# Patient Record
Sex: Female | Born: 1965 | Race: White | Hispanic: No | Marital: Single | Smoking: Current every day smoker
Health system: Southern US, Community
[De-identification: ages and names within clinical notes are randomized; demographics above are authoritative.]

## PROBLEM LIST (undated history)

## (undated) DIAGNOSIS — C801 Malignant (primary) neoplasm, unspecified: Secondary | ICD-10-CM

## (undated) DIAGNOSIS — F1910 Other psychoactive substance abuse, uncomplicated: Secondary | ICD-10-CM

## (undated) DIAGNOSIS — T7840XA Allergy, unspecified, initial encounter: Secondary | ICD-10-CM

## (undated) DIAGNOSIS — F329 Major depressive disorder, single episode, unspecified: Secondary | ICD-10-CM

## (undated) DIAGNOSIS — F32A Depression, unspecified: Secondary | ICD-10-CM

## (undated) HISTORY — DX: Allergy, unspecified, initial encounter: T78.40XA

## (undated) HISTORY — DX: Malignant (primary) neoplasm, unspecified: C80.1

## (undated) HISTORY — DX: Depression, unspecified: F32.A

## (undated) HISTORY — DX: Major depressive disorder, single episode, unspecified: F32.9

---

## 1997-07-23 ENCOUNTER — Other Ambulatory Visit: Admission: RE | Admit: 1997-07-23 | Discharge: 1997-07-23 | Payer: Self-pay | Admitting: Obstetrics and Gynecology

## 1998-02-04 ENCOUNTER — Other Ambulatory Visit: Admission: RE | Admit: 1998-02-04 | Discharge: 1998-02-04 | Payer: Self-pay | Admitting: Obstetrics and Gynecology

## 2000-03-22 ENCOUNTER — Other Ambulatory Visit: Admission: RE | Admit: 2000-03-22 | Discharge: 2000-03-22 | Payer: Self-pay | Admitting: Obstetrics and Gynecology

## 2002-05-07 ENCOUNTER — Other Ambulatory Visit: Admission: RE | Admit: 2002-05-07 | Discharge: 2002-05-07 | Payer: Self-pay | Admitting: Obstetrics and Gynecology

## 2003-06-26 ENCOUNTER — Other Ambulatory Visit: Admission: RE | Admit: 2003-06-26 | Discharge: 2003-06-26 | Payer: Self-pay | Admitting: Obstetrics and Gynecology

## 2004-07-14 ENCOUNTER — Other Ambulatory Visit: Admission: RE | Admit: 2004-07-14 | Discharge: 2004-07-14 | Payer: Self-pay | Admitting: Obstetrics and Gynecology

## 2007-01-15 ENCOUNTER — Encounter: Admission: RE | Admit: 2007-01-15 | Discharge: 2007-01-15 | Payer: Self-pay | Admitting: Obstetrics and Gynecology

## 2010-01-04 ENCOUNTER — Encounter
Admission: RE | Admit: 2010-01-04 | Discharge: 2010-01-04 | Payer: Self-pay | Source: Home / Self Care | Attending: Obstetrics and Gynecology | Admitting: Obstetrics and Gynecology

## 2010-01-23 ENCOUNTER — Encounter: Payer: Self-pay | Admitting: Obstetrics and Gynecology

## 2010-02-09 ENCOUNTER — Emergency Department (HOSPITAL_COMMUNITY)
Admission: EM | Admit: 2010-02-09 | Discharge: 2010-02-10 | Disposition: A | Discharge status: Home / Self Care | Payer: Self-pay | Attending: Emergency Medicine | Admitting: Emergency Medicine

## 2010-02-09 DIAGNOSIS — H5789 Other specified disorders of eye and adnexa: Secondary | ICD-10-CM | POA: Insufficient documentation

## 2010-02-09 DIAGNOSIS — Z79899 Other long term (current) drug therapy: Secondary | ICD-10-CM | POA: Insufficient documentation

## 2010-06-22 ENCOUNTER — Other Ambulatory Visit (HOSPITAL_COMMUNITY): Payer: Self-pay | Admitting: Family Medicine

## 2010-06-22 ENCOUNTER — Ambulatory Visit (HOSPITAL_COMMUNITY)
Admission: RE | Admit: 2010-06-22 | Discharge: 2010-06-22 | Disposition: A | Discharge status: Home / Self Care | Payer: Self-pay | Source: Ambulatory Visit | Attending: Family Medicine | Admitting: Family Medicine

## 2010-06-22 DIAGNOSIS — X58XXXA Exposure to other specified factors, initial encounter: Secondary | ICD-10-CM | POA: Insufficient documentation

## 2010-06-22 DIAGNOSIS — R52 Pain, unspecified: Secondary | ICD-10-CM

## 2010-06-22 DIAGNOSIS — S8990XA Unspecified injury of unspecified lower leg, initial encounter: Secondary | ICD-10-CM | POA: Insufficient documentation

## 2010-06-22 DIAGNOSIS — M25579 Pain in unspecified ankle and joints of unspecified foot: Secondary | ICD-10-CM | POA: Insufficient documentation

## 2011-12-22 ENCOUNTER — Encounter (HOSPITAL_BASED_OUTPATIENT_CLINIC_OR_DEPARTMENT_OTHER): Payer: Self-pay | Admitting: *Deleted

## 2011-12-22 ENCOUNTER — Emergency Department (HOSPITAL_BASED_OUTPATIENT_CLINIC_OR_DEPARTMENT_OTHER)
Admission: EM | Admit: 2011-12-22 | Discharge: 2011-12-23 | Disposition: A | Discharge status: Home / Self Care | Payer: Self-pay | Attending: Emergency Medicine | Admitting: Emergency Medicine

## 2011-12-22 DIAGNOSIS — F17200 Nicotine dependence, unspecified, uncomplicated: Secondary | ICD-10-CM | POA: Insufficient documentation

## 2011-12-22 DIAGNOSIS — W260XXA Contact with knife, initial encounter: Secondary | ICD-10-CM | POA: Insufficient documentation

## 2011-12-22 DIAGNOSIS — F1910 Other psychoactive substance abuse, uncomplicated: Secondary | ICD-10-CM | POA: Insufficient documentation

## 2011-12-22 DIAGNOSIS — Y929 Unspecified place or not applicable: Secondary | ICD-10-CM | POA: Insufficient documentation

## 2011-12-22 DIAGNOSIS — W261XXA Contact with sword or dagger, initial encounter: Secondary | ICD-10-CM | POA: Insufficient documentation

## 2011-12-22 DIAGNOSIS — Z23 Encounter for immunization: Secondary | ICD-10-CM | POA: Insufficient documentation

## 2011-12-22 DIAGNOSIS — S61409A Unspecified open wound of unspecified hand, initial encounter: Secondary | ICD-10-CM | POA: Insufficient documentation

## 2011-12-22 DIAGNOSIS — Y9389 Activity, other specified: Secondary | ICD-10-CM | POA: Insufficient documentation

## 2011-12-22 DIAGNOSIS — S61412A Laceration without foreign body of left hand, initial encounter: Secondary | ICD-10-CM

## 2011-12-22 HISTORY — DX: Other psychoactive substance abuse, uncomplicated: F19.10

## 2011-12-22 MED ORDER — TETANUS-DIPHTH-ACELL PERTUSSIS 5-2.5-18.5 LF-MCG/0.5 IM SUSP
0.5000 mL | Freq: Once | INTRAMUSCULAR | Status: AC
  Administered 2011-12-22: 0.5 mL via INTRAMUSCULAR
  Filled 2011-12-22: qty 0.5

## 2011-12-22 MED ORDER — LIDOCAINE-EPINEPHRINE 2 %-1:100000 IJ SOLN
INTRAMUSCULAR | Status: AC
  Administered 2011-12-22: 1 mL
  Filled 2011-12-22: qty 1

## 2011-12-22 NOTE — ED Notes (Signed)
MD at bedside. 
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
## 2011-12-23 NOTE — ED Notes (Signed)
D/c home with friend- no rx given - verbalizes understanding of home care instructions

## 2011-12-23 NOTE — ED Notes (Signed)
MD Bonk at bedside to suture
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
## 2012-03-02 IMAGING — CR DG ANKLE COMPLETE 3+V*L*
3 series · 3 of 3 positions shown · non-contrast
Comparison: None

CLINICAL DATA: Left ankle pain.

LEFT ANKLE COMPLETE - 3+ VIEW

[x ankle ap left]
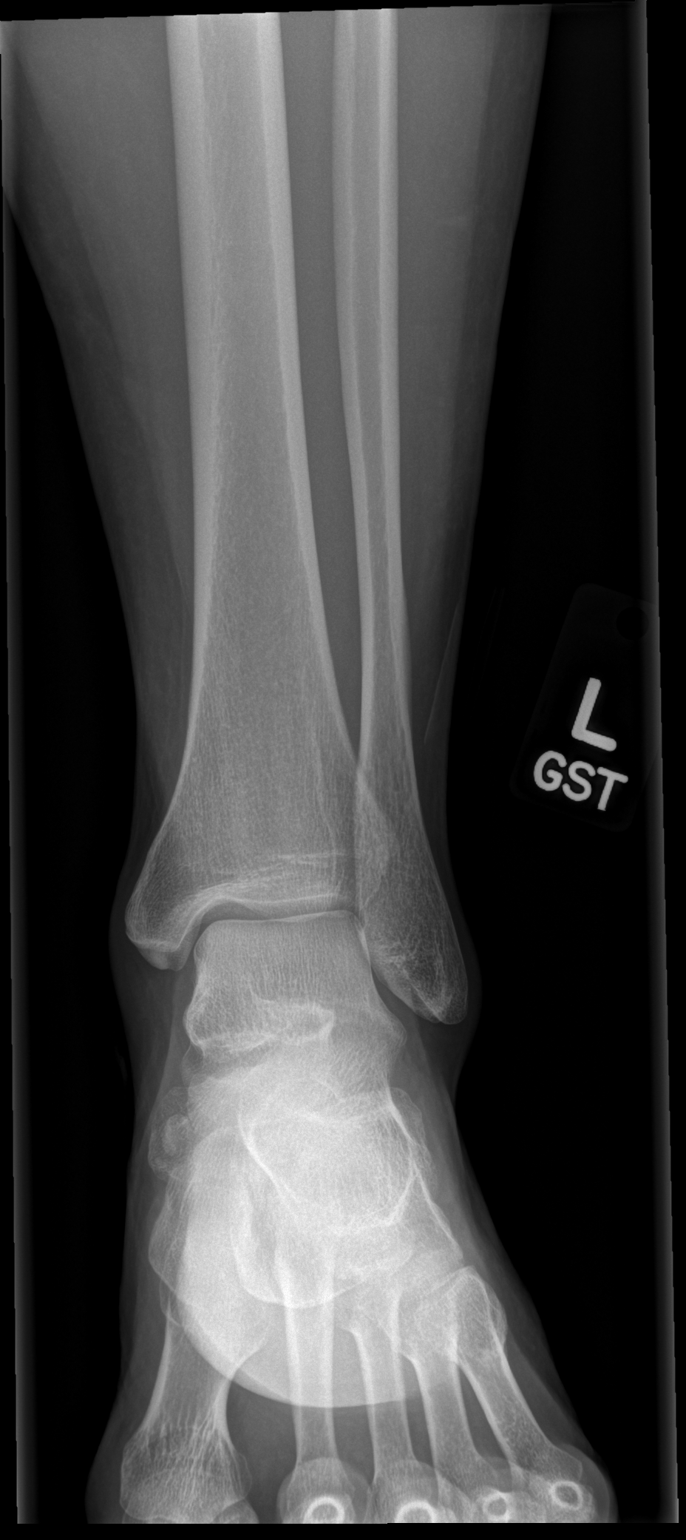

[x ankle obl left]
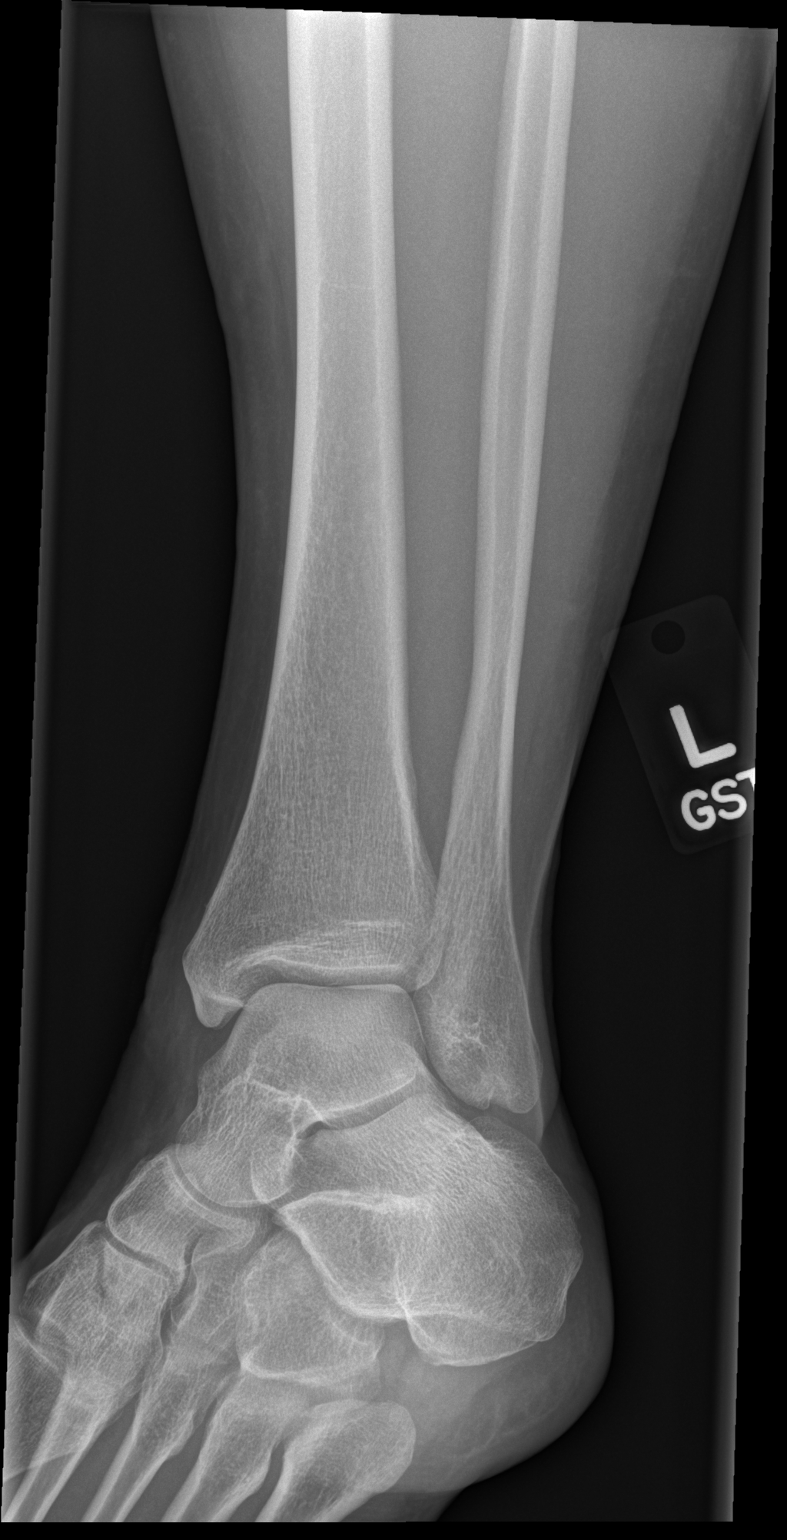

[x ankle lat left]
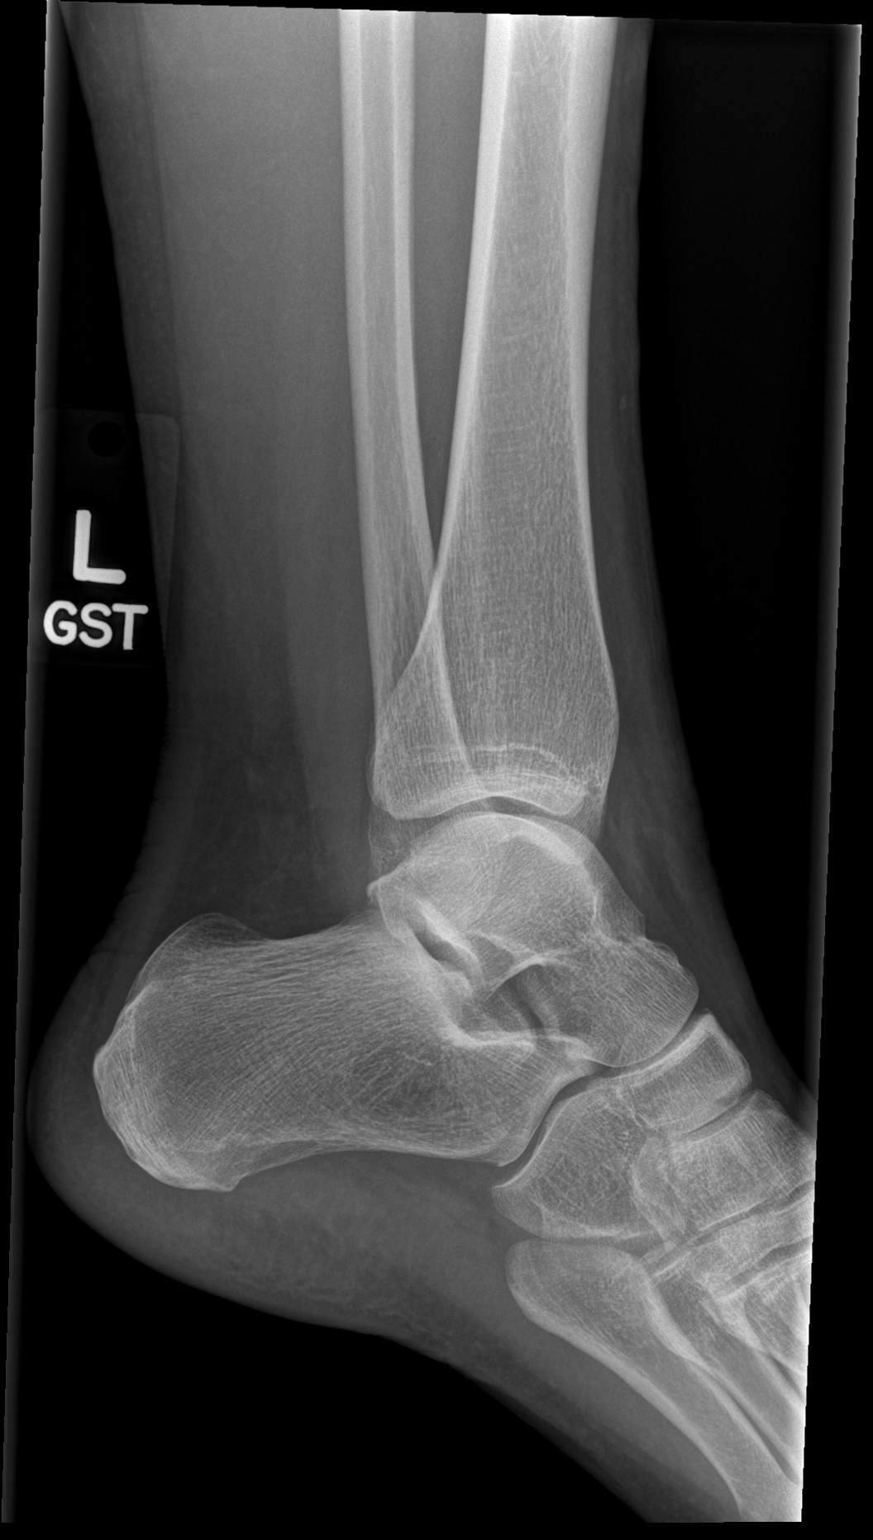

[3 of 3 positions shown; findings below may reference images not displayed]

FINDINGS: The ankle mortise is maintained.  No acute ankle fracture
or osteochondral lesion.  The talus and subtalar joints are
maintained.
IMPRESSION: No acute bony findings.

## 2014-01-21 ENCOUNTER — Ambulatory Visit (INDEPENDENT_AMBULATORY_CARE_PROVIDER_SITE_OTHER): Payer: Self-pay | Admitting: Emergency Medicine

## 2014-01-21 VITALS — BP 122/78 | HR 73 | Temp 98.3°F | Resp 16 | Ht 68.75 in | Wt 147.4 lb

## 2014-01-21 DIAGNOSIS — J018 Other acute sinusitis: Secondary | ICD-10-CM

## 2014-01-21 MED ORDER — AZITHROMYCIN 250 MG PO TABS: ORAL_TABLET | ORAL | Status: DC

## 2014-01-21 NOTE — Progress Notes (Addendum)
Subjective:  This chart was scribed for Arlyss Queen MD, by Tamsen Roers, at [facility].  This patient was seen in room 3 and the patient's care was started at 1:07 PM.    Patient ID: Abigail Fletcher, female    DOB: 1965/07/21, 49 y.o.   MRN: [id number]  HPI  HPI Comments: Abigail Fletcher is a 49 y.o. female who presents to [facility] for a head cold which has been going on for two weeks.  Notes that she has been stuffy and has rhinorrhea as well as a cough.  She has been using Mucinex, Sudafed and Robitussin to alleviate her symptoms which she regularly does with infections, but she notes this time, over the counter is not working.  She took Sudafed at [facility] this morning. Patient is self employed and self pay.  Dr. Rosana Hoes used to be her PCP for 15 years but she states that he left his practice so she does not have a doctor currently.  Patient is a smoker.     There are no active problems to display for this patient.  Past Medical History  Diagnosis Date  . Substance abuse   . Allergy   . Cancer     Melanoma  . Depression    History reviewed. No pertinent past surgical history. Allergies  Allergen Reactions  . Amoxicillin Rash  . Latex Rash   Prior to Admission medications   Medication Sig Start Date End Date Taking? Authorizing Provider  traZODone (DESYREL) 150 MG tablet Take 150 mg by mouth at bedtime.   Yes Historical Provider, MD  Vilazodone HCl (VIIBRYD) 40 MG TABS Take 40 mg by mouth daily.   Yes Historical Provider, MD   History   Social History  . Marital Status: Single    Spouse Name: N/A    Number of Children: N/A  . Years of Education: N/A   Occupational History  . Not on file.   Social History Main Topics  . Smoking status: Current Every Day Smoker -- 0.50 packs/day    Types: Cigarettes  . Smokeless tobacco: Not on file  . Alcohol Use: No  . Drug Use: No  . Sexual Activity: Not on file   Other Topics  Concern  . Not on file   Social History Narrative        Review of Systems  HENT: Positive for congestion and rhinorrhea.   Respiratory: Positive for cough. Negative for chest tightness and shortness of breath.        Objective:   Physical Exam   CONSTITUTIONAL: Well developed/well nourished, not ill appearing, cooperative.   HEAD: Normocephalic/atraumatic EYES: EOMI/PERRL ENMT: Mucous membranes moist, TM's are clear, some crusting on nose.  NECK: supple no meningeal signs SPINE/BACK:entire spine nontender CV: S1/S2 noted, no murmurs/rubs/gallops noted LUNGS: Lungs are clear to auscultation bilaterally, no apparent distress ABDOMEN: soft, nontender, no rebound or guarding, bowel sounds noted throughout abdomen GU:no cva tenderness NEURO: Pt is awake/alert/appropriate, moves all extremitiesx4.  No facial droop.   EXTREMITIES: pulses normal/equal, full ROM SKIN: warm, color normal PSYCH: no abnormalities of mood noted, alert and oriented to situation        Filed Vitals:   01/21/14 1258  BP: 122/78  Pulse: 73  Temp: 98.3 F (36.8 C)  TempSrc: Oral  Resp: 16  Height: 5' 8.75" (1.746 m)  Weight: 147 lb 6.4 oz (66.86 kg)  SpO2: 99%  Assessment & Plan:  Patient placed on Zithromax.. She has done well with this antibiotic in the past.I personally performed the services described in this documentation, which was scribed in my presence. The recorded information has been reviewed and is accurate.

## 2014-01-21 NOTE — Patient Instructions (Signed)

## 2014-05-13 ENCOUNTER — Ambulatory Visit (INDEPENDENT_AMBULATORY_CARE_PROVIDER_SITE_OTHER): Payer: Self-pay | Admitting: Physician Assistant

## 2014-05-13 VITALS — BP 111/68 | HR 75 | Temp 99.1°F | Resp 24 | Ht 68.0 in | Wt 151.5 lb

## 2014-05-13 DIAGNOSIS — J029 Acute pharyngitis, unspecified: Secondary | ICD-10-CM

## 2014-05-13 DIAGNOSIS — R509 Fever, unspecified: Secondary | ICD-10-CM

## 2014-05-13 LAB — POCT RAPID STREP A (OFFICE): RAPID STREP A SCREEN: NEGATIVE

## 2014-05-13 MED ORDER — FIRST-DUKES MOUTHWASH MT SUSP: 5.0000 mL | OROMUCOSAL | Status: AC | PRN

## 2014-05-13 MED ORDER — AZITHROMYCIN 250 MG PO TABS: ORAL_TABLET | ORAL | Status: AC

## 2014-05-13 MED ORDER — IBUPROFEN 200 MG PO TABS
400.0000 mg | ORAL_TABLET | Freq: Once | ORAL | Status: AC
  Administered 2014-05-13: 400 mg via ORAL

## 2014-05-13 NOTE — Progress Notes (Signed)
Subjective:    Patient ID: Abigail Fletcher, female    DOB: [birth year], 49 y.o.   MRN: [id number]  HPI Patient presents for sore throat that has been present for past 3 days. Has progressively gotten worse and pain woke her up last night. Swallowing is painful and has been unable to eat since the middle of the night. Has tried to stay hydrated, but does not think she is doing a good job. Endorses HA, nausea, myalgias, fever, and fatigue. Denies ear/sinus pressure, congestion, rhinorrhea, cough, SOB/CP, dizziness. H/o allergies, but not asthma. No known sick contacts.Daily 10-15 cigarette smoker, but has only smoked 1/2 cigarette today. Med allergy to amoxicillin.    Review of Systems  Constitutional: Positive for fever, appetite change and fatigue. Negative for chills.  HENT: Positive for sore throat and trouble swallowing. Negative for congestion, ear pain, postnasal drip, rhinorrhea, sinus pressure, sneezing and voice change.   Respiratory: Negative for cough and shortness of breath.   Cardiovascular: Negative for chest pain.  Gastrointestinal: Positive for nausea. Negative for vomiting and abdominal pain.  Neurological: Positive for headaches. Negative for dizziness and light-headedness.       Objective:   Physical Exam  Constitutional: She is oriented to person, place, and time. She appears well-developed and well-nourished. No distress.  Blood pressure 111/68, pulse 75, temperature 101.3 F (38.5 C), temperature source Oral, resp. rate 24, height 5\' 8"  (1.727 m), weight 151 lb 8 oz (68.72 kg), SpO2 96 %.  HENT:  Head: Normocephalic and atraumatic.  Right Ear: External ear normal.  Left Ear: External ear normal.  Mouth/Throat: Oropharyngeal exudate present.  2+ hypertrophic tonsil with erythema and exudate  Eyes: Conjunctivae are normal. Pupils are equal, round, and reactive to light. Right eye exhibits no discharge. Left eye exhibits no discharge. No scleral icterus.  Neck:  Normal range of motion. Neck supple. No thyromegaly present.  Cardiovascular: Normal rate, regular rhythm and normal heart sounds.  Exam reveals no gallop and no friction rub.   No murmur heard. Pulmonary/Chest: Effort normal and breath sounds normal. No respiratory distress. She has no wheezes. She has no rales.  Abdominal: Soft. Bowel sounds are normal. She exhibits no distension. There is no tenderness. There is no rebound and no guarding.  Lymphadenopathy:    She has cervical adenopathy.  Neurological: She is alert and oriented to person, place, and time.  Skin: Skin is warm and dry. No rash noted. She is not diaphoretic. No erythema. No pallor.  Psychiatric:  Tearful during exam secondary to pain.   Results for orders placed or performed in visit on 05/13/14  POCT rapid strep A  Result Value Ref Range   Rapid Strep A Screen Negative Negative      Assessment & Plan:  1. Sore throat 2. Other specified fever 3. Acute pharyngitis, unspecified pharyngitis type Plenty of fluid and may take ibuprofen for fever and pain symptoms. Try to eat something if going to take ibuprofen again. - POCT rapid strep A - Culture, Group A Strep - ibuprofen (ADVIL,MOTRIN) tablet 400 mg; Take 2 tablets (400 mg total) by mouth once. - azithromycin (ZITHROMAX) 250 MG tablet; Take 2 tabs PO x 1 dose, then 1 tab PO QD x 4 days  Dispense: 6 tablet; Refill: 0 - Diphenhyd-Hydrocort-Nystatin (FIRST-DUKES MOUTHWASH) SUSP; Use as directed 5 mLs in the mouth or throat every 2 (two) hours as needed. Mix with 2% visc. Lido, 1:1 ratio  Dispense: 120 mL; Refill: 0   Abigail Fletcher  [place] PA-C  [facility] 05/13/2014 6:58 PM

## 2014-05-16 LAB — CULTURE, GROUP A STREP: ORGANISM ID, BACTERIA: NORMAL

## 2014-05-17 ENCOUNTER — Encounter: Payer: Self-pay | Admitting: Physician Assistant

## 2017-12-21 ENCOUNTER — Other Ambulatory Visit: Payer: Self-pay

## 2017-12-21 ENCOUNTER — Emergency Department (HOSPITAL_COMMUNITY)
Admission: EM | Admit: 2017-12-21 | Discharge: 2017-12-21 | Disposition: A | Discharge status: Home / Self Care | Payer: Self-pay | Attending: Emergency Medicine | Admitting: Emergency Medicine

## 2017-12-21 ENCOUNTER — Emergency Department (HOSPITAL_COMMUNITY): Payer: Self-pay

## 2017-12-21 ENCOUNTER — Encounter (HOSPITAL_COMMUNITY): Payer: Self-pay | Admitting: *Deleted

## 2017-12-21 DIAGNOSIS — S61313A Laceration without foreign body of left middle finger with damage to nail, initial encounter: Secondary | ICD-10-CM | POA: Insufficient documentation

## 2017-12-21 DIAGNOSIS — W230XXA Caught, crushed, jammed, or pinched between moving objects, initial encounter: Secondary | ICD-10-CM | POA: Insufficient documentation

## 2017-12-21 DIAGNOSIS — S62639B Displaced fracture of distal phalanx of unspecified finger, initial encounter for open fracture: Secondary | ICD-10-CM

## 2017-12-21 DIAGNOSIS — Y998 Other external cause status: Secondary | ICD-10-CM | POA: Insufficient documentation

## 2017-12-21 DIAGNOSIS — S62633B Displaced fracture of distal phalanx of left middle finger, initial encounter for open fracture: Secondary | ICD-10-CM | POA: Insufficient documentation

## 2017-12-21 DIAGNOSIS — Z23 Encounter for immunization: Secondary | ICD-10-CM | POA: Insufficient documentation

## 2017-12-21 DIAGNOSIS — Y9389 Activity, other specified: Secondary | ICD-10-CM | POA: Insufficient documentation

## 2017-12-21 DIAGNOSIS — Y9289 Other specified places as the place of occurrence of the external cause: Secondary | ICD-10-CM | POA: Insufficient documentation

## 2017-12-21 MED ORDER — CLINDAMYCIN HCL 300 MG PO CAPS
300.0000 mg | ORAL_CAPSULE | Freq: Once | ORAL | Status: AC
  Administered 2017-12-21: 300 mg via ORAL
  Filled 2017-12-21: qty 1

## 2017-12-21 MED ORDER — TETANUS-DIPHTH-ACELL PERTUSSIS 5-2.5-18.5 LF-MCG/0.5 IM SUSP
0.5000 mL | Freq: Once | INTRAMUSCULAR | Status: AC
  Administered 2017-12-21: 0.5 mL via INTRAMUSCULAR
  Filled 2017-12-21: qty 0.5

## 2017-12-21 MED ORDER — CLINDAMYCIN HCL 300 MG PO CAPS: 300.0000 mg | ORAL_CAPSULE | Freq: Three times a day (TID) | ORAL | 0 refills | Status: AC

## 2017-12-21 MED ORDER — LIDOCAINE HCL (PF) 1 % IJ SOLN
10.0000 mL | Freq: Once | INTRAMUSCULAR | Status: AC
  Administered 2017-12-21: 10 mL
  Filled 2017-12-21: qty 30

## 2017-12-21 NOTE — Discharge Instructions (Addendum)
Call the hand doctor in the morning to set up a follow-up appointment.  Ideally they will see you on Friday or Monday. Take antibiotics as prescribed.  Take the entire course.  Take Tylenol and ibuprofen as needed for pain. Use ice packs to help with pain and swelling. Wash the area daily with soap and water, reapplying a new dressing and reapplying the splint after washing. Sutures will need to be removed in 10 days or when the hand doctor says. Return to the emergency room with any new, worsening, concerning symptoms.

## 2017-12-21 NOTE — ED Triage Notes (Signed)
Pt got her left hand middle finger caught in a door. Base of fingernail is uplifted, discoloration to the tip of the finger.

## 2017-12-21 NOTE — ED Provider Notes (Signed)
[facility] DEPT Provider Note   CSN: [id number] Arrival date & time: 12/21/17  0033     History   Chief Complaint Chief Complaint  Patient presents with  . Finger Injury    HPI Abigail Fletcher is a 52 y.o. female presenting for evaluation of finger injury.  Patient states just prior to arrival, she went to close a door when her left middle finger got caught in the closing door.  She reports acute onset left middle finger pain and bleeding.  She denies numbness or tingling.  She does not know when her last tetanus shot was.  She is not on any blood thinners.  She reports a history of substance abuse, has been clean for 20 years.  She takes no medications daily.  She has not taken anything for pain including Tylenol or ibuprofen.  She denies injury elsewhere.  HPI  Past Medical History:  Diagnosis Date  . Allergy   . Cancer ([place])    Melanoma  . Depression   . Substance abuse ([place])     There are no active problems to display for this patient.   History reviewed. No pertinent surgical history.   OB History   No obstetric history on file.      Home Medications    Prior to Admission medications   Medication Sig Start Date End Date Taking? Authorizing Provider  azithromycin (ZITHROMAX) 250 MG tablet Take 2 tabs PO x 1 dose, then 1 tab PO QD x 4 days 05/13/14   [place], Tishira R, PA-C  clindamycin (CLEOCIN) 300 MG capsule Take 1 capsule (300 mg total) by mouth 3 (three) times daily for 5 days. 12/21/17 12/26/17  Daniesha Driver, PA-C  Diphenhyd-Hydrocort-Nystatin (FIRST-DUKES MOUTHWASH) SUSP Use as directed 5 mLs in the mouth or throat every 2 (two) hours as needed. Mix with 2% visc. Lido, 1:1 ratio 05/13/14   Brewington, Tishira R, PA-C  traZODone (DESYREL) 150 MG tablet Take 150 mg by mouth at bedtime.    [provider]  Vilazodone HCl (VIIBRYD) 40 MG TABS Take 40 mg by mouth daily.    [provider]    Family  History Family History  Problem Relation Age of Onset  . Cancer Mother   . Diabetes Mother   . Hyperlipidemia Mother   . Cancer Father     Social History Social History   Tobacco Use  . Smoking status: Current Every Day Smoker    Packs/day: 0.50    Types: Cigarettes  . Smokeless tobacco: Never Used  Substance Use Topics  . Alcohol use: No    Alcohol/week: 0.0 standard drinks  . Drug use: No     Allergies   Amoxicillin and Latex   Review of Systems Review of Systems  Skin: Positive for wound.  Hematological: Does not bruise/bleed easily.     Physical Exam Updated Vital Signs BP (!) 149/99   Pulse 65   Temp 98.2 F (36.8 C) (Oral)   Resp 20   SpO2 99%   Physical Exam Vitals signs and nursing note reviewed.  Constitutional:      General: She is not in acute distress.    Appearance: She is well-developed.  HENT:     Head: Normocephalic and atraumatic.  Neck:     Musculoskeletal: Normal range of motion.  Pulmonary:     Effort: Pulmonary effort is normal.  Abdominal:     General: There is no distension.  Musculoskeletal: Normal range of motion.  General: Tenderness and signs of injury present.     Comments: See picture below.  Left middle finger nail bed is exposed with laceration of the left middle finger.  Good cap refill.  Minimal bleeding.  Sensation intact.  Strength against resistance intact.  Skin:    General: Skin is warm.     Capillary Refill: Capillary refill takes less than 2 seconds.     Findings: No rash.  Neurological:     Mental Status: She is alert and oriented to person, place, and time.        ED Treatments / Results  Labs (all labs ordered are listed, but only abnormal results are displayed) Labs Reviewed - No data to display  EKG None  Radiology Dg Finger Middle Left  Result Date: 12/21/2017 CLINICAL DATA:  Crush injury of the middle finger after slamming it in a door. EXAM: LEFT MIDDLE FINGER 2+V COMPARISON:   None. FINDINGS: Comminuted, mildly distracted fracture of the middle finger distal tuft. Associated dorsal soft tissue laceration. Joint spaces are preserved. IMPRESSION: Middle finger distal tuft fracture and soft tissue laceration. Electronically Signed   By: Titus Dubin M.D.   On: 12/21/2017 01:11    Procedures .Marland KitchenLaceration Repair Date/Time: 12/21/2017 2:49 AM Performed by: Franchot Heidelberg, PA-C Authorized by: Franchot Heidelberg, PA-C   Consent:    Consent obtained:  Verbal   Consent given by:  Patient   Risks discussed:  Infection, pain, poor cosmetic result, poor wound healing and need for additional repair Anesthesia (see MAR for exact dosages):    Anesthesia method:  Nerve block   Block location:  L middle finger   Block needle gauge:  25 G   Block anesthetic:  Lidocaine 1% w/o epi   Block technique:  Tendon sheath digital block   Block injection procedure:  Anatomic landmarks identified, introduced needle, incremental injection, negative aspiration for blood and anatomic landmarks palpated   Block outcome:  Anesthesia achieved Laceration details:    Location:  Finger   Finger location:  L long finger   Length (cm):  1   Depth (mm):  4 Repair type:    Repair type:  Intermediate Pre-procedure details:    Preparation:  Patient was prepped and draped in usual sterile fashion and imaging obtained to evaluate for foreign bodies Exploration:    Wound exploration: wound explored through full range of motion and entire depth of wound probed and visualized     Wound extent: underlying fracture   Treatment:    Area cleansed with:  Saline   Amount of cleaning:  Extensive   Irrigation solution:  Sterile saline   Irrigation method:  Syringe Skin repair:    Repair method:  Sutures   Suture size:  4-0   Suture material:  Prolene   Suture technique:  Simple interrupted   Number of sutures:  3 Approximation:    Approximation:  Close Post-procedure details:    Dressing:   Splint for protection   Patient tolerance of procedure:  Tolerated well, no immediate complications Comments:     Nail remain largely attached to the nailbed, despite exposure of the proximal aspect of the nail.  Nail was re-bedded, and sutures applied for anchoring.  Laceration repaired.   (including critical care time)  Medications Ordered in ED Medications  clindamycin (CLEOCIN) capsule 300 mg (has no administration in time range)  Tdap (BOOSTRIX) injection 0.5 mL (0.5 mLs Intramuscular Given 12/21/17 0147)  lidocaine (PF) (XYLOCAINE) 1 % injection 10 mL (10  mLs Infiltration Given 12/21/17 0150)     Initial Impression / Assessment and Plan / ED Course  I have reviewed the triage vital signs and the nursing notes.  Pertinent labs & imaging results that were available during my care of the patient were reviewed by me and considered in my medical decision making (see chart for details).     Presenting for evaluation of finger injury.  Physical exam reassuring, she is neurovascular intact.  X-ray shows underlying tuft fracture.  Nail was re-padded and anchored as described above.  Will place patient on antibiotics, apply dressing and finger splint and have her follow-up with hand for further evaluation and management.  Discussed plan with patient.  At this time, patient appears safe for discharge.  Return precautions given.  Patient states she understands and agrees to plan.   Final Clinical Impressions(s) / ED Diagnoses   Final diagnoses:  Open fracture of tuft of distal phalanx of finger  Laceration of left middle finger without foreign body with damage to nail, initial encounter    ED Discharge Orders         Ordered    clindamycin (CLEOCIN) 300 MG capsule  3 times daily     12/21/17 0240           Franchot Heidelberg, PA-C 12/21/17 0253    Veryl Speak, MD 12/21/17 [phone]

## 2023-11-12 ENCOUNTER — Other Ambulatory Visit: Payer: Self-pay

## 2023-11-12 ENCOUNTER — Ambulatory Visit (INDEPENDENT_AMBULATORY_CARE_PROVIDER_SITE_OTHER): Payer: Self-pay | Admitting: Orthopedic Surgery

## 2023-11-12 ENCOUNTER — Encounter: Payer: Self-pay | Admitting: Orthopedic Surgery

## 2023-11-12 DIAGNOSIS — M25552 Pain in left hip: Secondary | ICD-10-CM

## 2023-11-12 DIAGNOSIS — M1612 Unilateral primary osteoarthritis, left hip: Secondary | ICD-10-CM

## 2023-11-12 NOTE — Progress Notes (Unsigned)
 Office Visit Note   Patient: Abigail Fletcher           Date of Birth: 1965-12-11           MRN: [id number] Visit Date: 11/12/2023 Requested by: No referring provider defined for this encounter. PCP: Patient, No Pcp Per  Subjective: Chief Complaint  Patient presents with   Left Hip - Pain    HPI: Abigail Fletcher is a 58 y.o. female who presents to the office reporting left hip pain for 6 years.  Denies any history of injury.  Patient states she fell onto a curb about 15 years ago on the right-hand side..  She does report some decrease strength in the left leg.  Does report a little bit of anterior hip pain but no low back pain.  Does have a little bit of trochanteric tenderness and pain as well.  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  No history of diabetes.              ROS: All systems reviewed are negative as they relate to the chief complaint within the history of present illness.  Patient denies fevers or chills.  Assessment & Plan: Visit Diagnoses:  1. Pain in left hip     Plan: Impression is left hip arthritis.  Plan is left hip injection for diagnostic and therapeutic purposes.  We could consider repeat injection in the hip and/or injection into the trochanteric bursa.  Did look at the trochanteric bursa under ultrasound today and did not show too much in terms of inflammation.  Or edema.  We will see her back at that time.  Follow-Up Instructions: No follow-ups on file.   Orders:  Orders Placed This Encounter  Procedures   XR HIP UNILAT W OR W/O PELVIS 2-3 VIEWS LEFT   US  Guided Needle Placement - No Linked Charges   No orders of the defined types were placed in this encounter.     Procedures: Large Joint Inj: L hip joint on 11/12/2023 5:07 PM Indications: pain and diagnostic evaluation Details: 22 G 3.5 in needle, ultrasound-guided lateral approach  Arthrogram: No  Medications: 5 mL  lidocaine  1 %; 4 mL bupivacaine 0.25 %; 40 mg triamcinolone acetonide 40 MG/ML Outcome: tolerated well, no immediate complications Procedure, treatment alternatives, risks and benefits explained, specific risks discussed. Consent was given by the patient. Immediately prior to procedure a time out was called to verify the correct patient, procedure, equipment, support staff and site/side marked as required. Patient was prepped and draped in the usual sterile fashion.       Clinical Data: No additional findings.  Objective: Vital Signs: There were no vitals taken for this visit.  Physical Exam:  Constitutional: Patient appears well-developed HEENT:  Head: Normocephalic Eyes:EOM are normal Neck: Normal range of motion Cardiovascular: Normal rate Pulmonary/chest: Effort normal Neurologic: Patient is alert Skin: Skin is warm Psychiatric: Patient has normal mood and affect  Ortho Exam: Ortho exam demonstrates normal gait alignment.  No Trendelenburg gait.  Has 5 out of 5 ankle dorsiflexion plantarflexion quad and hamstring strength.  Does have some decreased range of motion left versus right in terms of internal and external rotation.  Mild trochanteric tenderness is present on the left not on the right.  Has very good abduction adduction and hip flexion strength.  Specialty Comments:  No specialty comments available.  Imaging: US  Guided Needle Placement -  No Linked Charges Result Date: 11/12/2023 Ultrasound imaging demonstrates needle placement into the glenohumeral joint with injection of fluid into the joint and no complicating features.   XR HIP UNILAT W OR W/O PELVIS 2-3 VIEWS LEFT Result Date: 11/12/2023 AP pelvis lateral radiographs left hip reviewed.  Moderate arthritis is present.  Spurring is noted on the femoral head superiorly and inferiorly.  No acute fracture.  Remainder Bony pelvis unremarkable    PMFS History: There are no active problems to display for this  patient.  Past Medical History:  Diagnosis Date   Allergy    Cancer (HCC)    Melanoma   Depression    Substance abuse (HCC)     Family History  Problem Relation Age of Onset   Cancer Mother    Diabetes Mother    Hyperlipidemia Mother    Cancer Father     History reviewed. No pertinent surgical history. Social History   Occupational History   Not on file  Tobacco Use   Smoking status: Every Day    Current packs/day: 0.50    Types: Cigarettes   Smokeless tobacco: Never  Substance and Sexual Activity   Alcohol use: No    Alcohol/week: 0.0 standard drinks of alcohol   Drug use: No   Sexual activity: Not on file

## 2023-11-13 MED ORDER — LIDOCAINE HCL 1 % IJ SOLN
5.0000 mL | INTRAMUSCULAR | Status: AC | PRN
  Administered 2023-11-12: 5 mL

## 2023-11-13 MED ORDER — BUPIVACAINE HCL 0.25 % IJ SOLN
4.0000 mL | INTRAMUSCULAR | Status: AC | PRN
  Administered 2023-11-12: 4 mL via INTRA_ARTICULAR

## 2023-11-13 MED ORDER — TRIAMCINOLONE ACETONIDE 40 MG/ML IJ SUSP
40.0000 mg | INTRAMUSCULAR | Status: AC | PRN
  Administered 2023-11-12: 40 mg via INTRA_ARTICULAR

## 2024-04-14 ENCOUNTER — Ambulatory Visit: Payer: Self-pay | Admitting: Orthopedic Surgery

## 2024-04-21 ENCOUNTER — Ambulatory Visit: Payer: Self-pay | Admitting: Orthopedic Surgery
